# Patient Record
Sex: Female | Born: 1995
Health system: Southern US, Community
[De-identification: ages and names within clinical notes are randomized; demographics above are authoritative.]

---
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
## 2014-02-18 ENCOUNTER — Encounter (HOSPITAL_COMMUNITY): Payer: Self-pay | Admitting: Emergency Medicine

## 2014-02-18 ENCOUNTER — Emergency Department (HOSPITAL_COMMUNITY): Payer: Self-pay

## 2014-02-18 ENCOUNTER — Emergency Department (INDEPENDENT_AMBULATORY_CARE_PROVIDER_SITE_OTHER)
Admission: EM | Admit: 2014-02-18 | Discharge: 2014-02-18 | Disposition: A | Discharge status: Other Acute Inpatient Hospital | Payer: Self-pay | Source: Home / Self Care

## 2014-02-18 ENCOUNTER — Emergency Department (HOSPITAL_COMMUNITY)
Admission: EM | Admit: 2014-02-18 | Discharge: 2014-02-18 | Disposition: A | Discharge status: Home Health | Payer: Self-pay | Attending: Emergency Medicine | Admitting: Emergency Medicine

## 2014-02-18 DIAGNOSIS — S02609A Fracture of mandible, unspecified, initial encounter for closed fracture: Secondary | ICD-10-CM | POA: Insufficient documentation

## 2014-02-18 DIAGNOSIS — S199XXA Unspecified injury of neck, initial encounter: Secondary | ICD-10-CM

## 2014-02-18 DIAGNOSIS — X58XXXA Exposure to other specified factors, initial encounter: Secondary | ICD-10-CM

## 2014-02-18 DIAGNOSIS — Z79899 Other long term (current) drug therapy: Secondary | ICD-10-CM | POA: Insufficient documentation

## 2014-02-18 DIAGNOSIS — S0993XA Unspecified injury of face, initial encounter: Secondary | ICD-10-CM

## 2014-02-18 MED ORDER — HYDROCODONE-ACETAMINOPHEN 7.5-325 MG/15ML PO SOLN: 10.0000 mL | ORAL | Status: DC | PRN

## 2014-02-18 MED ORDER — MORPHINE SULFATE 4 MG/ML IJ SOLN
4.0000 mg | Freq: Once | INTRAMUSCULAR | Status: AC
  Administered 2014-02-18: 4 mg via INTRAMUSCULAR
  Filled 2014-02-18: qty 1
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
## 2014-02-18 NOTE — ED Notes (Signed)
Patient is resting comfortably. 
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
## 2014-02-18 NOTE — Discharge Instructions (Signed)
Fractured-Jaw Meal Plan The purpose of the fractured-jaw meal plan is to provide foods that can be easily blended and easily swallowed. This plan is typically used after jaw or mouth surgery, wired jaw surgery, or dental surgery. Foods in this plan need to be blended so that they can be sipped from a straw or given through a syringe. You should try to have at least three meals and three snacks daily. It is important to make sure you get enough calories and protein to prevent weight loss and help your body heal, especially after surgery. You may wish to include a liquid multivitamin in your plan to ensure that you get all the vitamins and minerals you need. Ask your health care provider for a recommendation.  HOW DO I PREPARE MY MEALS? All foods in this plan must be blended. Avoid nuts, seeds, skins, peels, bones, or any foods that cannot be blended to the right consistency. Make sure to eat a variety of foods from each food group every day. The following tips can help you as you blend your food:  Remove skins, seeds, and peels from food.  Cook meats and vegetables thoroughly.  Cut foods into small pieces and mix with a small amount of liquid in a food processor or blender. Continue to add liquid until the food becomes thin enough to sip through a straw.  Adding liquids such as juice, milk, cream, broth, gravy, or vegetable juice can help add flavor to foods.  Heat foods after they have been blended to reduce the amount of foam created from blending.  Heat or cool your foods to lukewarm temperatures if your teeth and mouth are sensitive to extreme temperatures. WHAT FOODS CAN I EAT? Make sure to eat a variety of foods from each food group.  Grains  Hot cereals, such as oatmeal, grits, ground wheat cereals, and polenta.  Rice and pasta.  Couscous. Vegetables  All cooked or canned vegetables, without seeds and skins.  Vegetable juices.  Cooked potatoes, without skins. Fruit  Any  cooked or canned fruits, without seeds and skins.  Fresh, peeled soft fruits, such as bananas and peaches, that can be blended until smooth.  All fruit juices, without seeds and skins. Meat and Other Protein Sources  Soft-boiled eggs, scrambled eggs, powdered eggs, pasteurized egg mixtures, and custard.  Ground meats, such as hamburger, [place]key, sausage, and meatloaf.  Tender, well-cooked meat, poultry, and fish prepared without bones or skin.  Soft soy foods (such as tofu).  Smooth nut butters. Dairy  All are allowed. Beverages  Coffee (regular or decaffeinated), tea, and mineral water. Condiments  All seasonings and condiments that blend well. WHEN MAY I NEED TO SUPPLEMENT MY MEALS? If you begin to lose weight on this plan, you may need to increase the amount of food you are eating or the number of calories in your food or both. You can increase the number of calories by adding any of the following foods:  Protein powder or powdered milk.  Extra fats, such as margarine (without trans fat), sour cream, cream cheese, cream, and nut butters, such as peanut butter or almond butter.  Sweets, such as honey, ice cream, blackstrap molasses, or sugar. Document Released: 01/21/2010 Document Revised: 08/08/2013 Document Reviewed: 06/30/2013 [facility] Patient Information 2015 [employer] [place]. This information is not intended to replace advice given to you by your health care provider. Make sure you discuss any questions you have with your health care provider.
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
## 2014-02-18 NOTE — ED Notes (Signed)
Returned to room from radiology
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
## 2014-02-18 NOTE — ED Notes (Signed)
MD at bedside.MD at bedside

## 2014-02-19 NOTE — ED Provider Notes (Signed)
Medical screening examination/treatment/procedure(s) were performed by non-physician practitioner and as supervising physician I was immediately available for consultation/collaboration.  Leslee Homeavid Rasmus Preusser, M.D.  Reuben Likesavid C Haruna Rohlfs, MD 02/19/14 [phone]

## 2014-02-19 NOTE — ED Provider Notes (Signed)
I saw and evaluated the patient, reviewed the resident's note and I agree with the findings and plan.   EKG Interpretation None      Patient's CT shows nondisplaced fx. Will give liquid pain meds, soft diet, etc. ENT f/u. Has mildly decreased opening of mouth but is able to open mouth and swallow  Audree CamelScott T Maebry Obrien, MD 02/19/14 1219

## 2014-09-28 ENCOUNTER — Encounter (HOSPITAL_COMMUNITY): Payer: Self-pay | Admitting: Emergency Medicine

## 2014-09-28 ENCOUNTER — Emergency Department (INDEPENDENT_AMBULATORY_CARE_PROVIDER_SITE_OTHER): Payer: Self-pay

## 2014-09-28 ENCOUNTER — Emergency Department (INDEPENDENT_AMBULATORY_CARE_PROVIDER_SITE_OTHER)
Admission: EM | Admit: 2014-09-28 | Discharge: 2014-09-28 | Disposition: A | Discharge status: Home / Self Care | Payer: Self-pay | Source: Home / Self Care | Attending: Emergency Medicine | Admitting: Emergency Medicine

## 2014-09-28 DIAGNOSIS — S60419A Abrasion of unspecified finger, initial encounter: Secondary | ICD-10-CM

## 2014-09-28 DIAGNOSIS — S60021A Contusion of right index finger without damage to nail, initial encounter: Secondary | ICD-10-CM

## 2014-09-28 MED ORDER — BACITRACIN 500 UNIT/GM EX OINT
1.0000 "application " | TOPICAL_OINTMENT | Freq: Once | CUTANEOUS | Status: AC
  Administered 2014-09-28: 1 via TOPICAL

## 2014-09-28 NOTE — Discharge Instructions (Signed)
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
## 2015-11-07 ENCOUNTER — Emergency Department (HOSPITAL_COMMUNITY)
Admission: EM | Admit: 2015-11-07 | Discharge: 2015-11-07 | Disposition: A | Discharge status: Home / Self Care | Payer: Self-pay | Attending: Emergency Medicine | Admitting: Emergency Medicine

## 2015-11-07 ENCOUNTER — Encounter (HOSPITAL_COMMUNITY): Payer: Self-pay | Admitting: Emergency Medicine

## 2015-11-07 DIAGNOSIS — R05 Cough: Secondary | ICD-10-CM | POA: Insufficient documentation

## 2015-11-07 DIAGNOSIS — R509 Fever, unspecified: Secondary | ICD-10-CM | POA: Insufficient documentation

## 2015-11-07 DIAGNOSIS — R519 Headache, unspecified: Secondary | ICD-10-CM

## 2015-11-07 DIAGNOSIS — M542 Cervicalgia: Secondary | ICD-10-CM | POA: Insufficient documentation

## 2015-11-07 DIAGNOSIS — R51 Headache: Secondary | ICD-10-CM | POA: Insufficient documentation

## 2015-11-07 DIAGNOSIS — R000 Tachycardia, unspecified: Secondary | ICD-10-CM | POA: Insufficient documentation

## 2015-11-07 DIAGNOSIS — Z793 Long term (current) use of hormonal contraceptives: Secondary | ICD-10-CM | POA: Insufficient documentation

## 2015-11-07 DIAGNOSIS — J029 Acute pharyngitis, unspecified: Secondary | ICD-10-CM | POA: Insufficient documentation

## 2015-11-07 DIAGNOSIS — Z3202 Encounter for pregnancy test, result negative: Secondary | ICD-10-CM | POA: Insufficient documentation

## 2015-11-07 DIAGNOSIS — R6889 Other general symptoms and signs: Secondary | ICD-10-CM

## 2015-11-07 LAB — URINALYSIS, ROUTINE W REFLEX MICROSCOPIC
Bilirubin Urine: NEGATIVE
Glucose, UA: NEGATIVE mg/dL
Ketones, ur: NEGATIVE mg/dL
Leukocytes, UA: NEGATIVE
Nitrite: NEGATIVE
PROTEIN: NEGATIVE mg/dL
SPECIFIC GRAVITY, URINE: 1.009 (ref 1.005–1.030)
pH: 7.5 (ref 5.0–8.0)

## 2015-11-07 LAB — COMPREHENSIVE METABOLIC PANEL
ALT: 13 U/L — AB (ref 14–54)
AST: 22 U/L (ref 15–41)
Albumin: 3.7 g/dL (ref 3.5–5.0)
Alkaline Phosphatase: 53 U/L (ref 38–126)
Anion gap: 10 (ref 5–15)
BUN: 7 mg/dL (ref 6–20)
CHLORIDE: 105 mmol/L (ref 101–111)
CO2: 24 mmol/L (ref 22–32)
CREATININE: 0.68 mg/dL (ref 0.44–1.00)
Calcium: 9 mg/dL (ref 8.9–10.3)
GFR calc Af Amer: >60 mL/min (ref >60)
Glucose, Bld: 105 mg/dL — ABNORMAL HIGH (ref 65–99)
Potassium: 3.8 mmol/L (ref 3.5–5.1)
SODIUM: 139 mmol/L (ref 135–145)
Total Bilirubin: 0.1 mg/dL — ABNORMAL LOW (ref 0.3–1.2)
Total Protein: 8.1 g/dL (ref 6.5–8.1)

## 2015-11-07 LAB — CBC WITH DIFFERENTIAL/PLATELET
BASOS ABS: 0 10*3/uL (ref 0.0–0.1)
Basophils Relative: 0 %
EOS PCT: 0 %
Eosinophils Absolute: 0 10*3/uL (ref 0.0–0.7)
HCT: 36.4 % (ref 36.0–46.0)
HEMOGLOBIN: 11.9 g/dL — AB (ref 12.0–15.0)
LYMPHS PCT: 42 %
Lymphs Abs: 2.5 10*3/uL (ref 0.7–4.0)
MCH: 27.9 pg (ref 26.0–34.0)
MCHC: 32.7 g/dL (ref 30.0–36.0)
MCV: 85.4 fL (ref 78.0–100.0)
Monocytes Absolute: 0.4 10*3/uL (ref 0.1–1.0)
Monocytes Relative: 7 %
NEUTROS PCT: 51 %
Neutro Abs: 3.1 10*3/uL (ref 1.7–7.7)
PLATELETS: 337 10*3/uL (ref 150–400)
RBC: 4.26 MIL/uL (ref 3.87–5.11)
RDW: 12.5 % (ref 11.5–15.5)
WBC: 6.1 10*3/uL (ref 4.0–10.5)

## 2015-11-07 LAB — URINE MICROSCOPIC-ADD ON

## 2015-11-07 LAB — I-STAT CG4 LACTIC ACID, ED: Lactic Acid, Venous: 0.9 mmol/L (ref 0.5–2.0)

## 2015-11-07 LAB — I-STAT BETA HCG BLOOD, ED (MC, WL, AP ONLY)

## 2015-11-07 MED ORDER — KETOROLAC TROMETHAMINE 30 MG/ML IJ SOLN
30.0000 mg | Freq: Once | INTRAMUSCULAR | Status: AC
  Administered 2015-11-07: 30 mg via INTRAVENOUS
  Filled 2015-11-07: qty 1

## 2015-11-07 MED ORDER — ACETAMINOPHEN 325 MG PO TABS
650.0000 mg | ORAL_TABLET | Freq: Once | ORAL | Status: AC
  Administered 2015-11-07: 650 mg via ORAL
  Filled 2015-11-07: qty 2

## 2015-11-07 MED ORDER — SODIUM CHLORIDE 0.9 % IV BOLUS (SEPSIS)
1000.0000 mL | Freq: Once | INTRAVENOUS | Status: AC
  Administered 2015-11-07: 1000 mL via INTRAVENOUS

## 2015-11-07 MED ORDER — IBUPROFEN 600 MG PO TABS: 600.0000 mg | ORAL_TABLET | Freq: Four times a day (QID) | ORAL | Status: DC | PRN

## 2015-11-07 NOTE — ED Provider Notes (Signed)
CSN: [id number]     Arrival date & time 11/07/15  [id number] History   First MD Initiated Contact with Patient 11/07/15 1028     Chief Complaint  Patient presents with  . Headache  . Torticollis  . Tachycardia     (Consider location/radiation/quality/duration/timing/severity/associated sxs/prior Treatment) HPI Gloria Chambers is a 20 y.o. female with no medical problems, presents to emergency department complaining of a headache for 4 days, swollen lymph nodes in the right side of the neck, pain to the right side of the neck, cough, scratchy throat. States that the headache is worse with coughing or with loud noises. She denies similar headaches in the past. Denies nausea or vomiting. Denies photophobia. Denies neck stiffness. States that she has had close contacts with few people who had the flu. Denies any urinary symptoms or vaginal discharge. States that she took some Excedrin Migraine which did not help.  History reviewed. No pertinent past medical history. History reviewed. No pertinent past surgical history. History reviewed. No pertinent family history. Social History  Substance Use Topics  . Smoking status: Never Smoker   . Smokeless tobacco: None  . Alcohol Use: No   OB History    No data available     Review of Systems  Constitutional: Positive for fever and chills.  HENT: Positive for sore throat.   Eyes: Negative for photophobia.  Respiratory: Positive for cough. Negative for chest tightness and shortness of breath.   Cardiovascular: Negative for chest pain, palpitations and leg swelling.  Gastrointestinal: Negative for nausea, vomiting, abdominal pain and diarrhea.  Genitourinary: Negative for dysuria, flank pain, vaginal bleeding, vaginal discharge, vaginal pain and pelvic pain.  Musculoskeletal: Negative for myalgias, arthralgias, neck pain and neck stiffness.  Skin: Negative for rash.  Neurological: Positive for headaches. Negative for dizziness, weakness and  light-headedness.  All other systems reviewed and are negative.     Allergies  Review of patient's allergies indicates no known allergies.  Home Medications   Prior to Admission medications   Medication Sig Start Date End Date Taking? Authorizing Provider  aspirin-acetaminophen-caffeine (EXCEDRIN MIGRAINE) [id number] MG tablet Take 1 tablet by mouth every 6 (six) hours as needed for headache.   Yes Historical Provider, MD  Norgestimate-Ethinyl Estradiol Triphasic (ORTHO TRI-CYCLEN, 28,) 0.18/0.215/0.25 MG-35 MCG tablet Take 1 tablet by mouth daily.   Yes Historical Provider, MD   BP 121/77 mmHg  Pulse 137  Temp(Src) 101 F (38.3 C) (Oral)  Resp 18  SpO2 100% Physical Exam  Constitutional: She is oriented to person, place, and time. She appears well-developed and well-nourished. No distress.  HENT:  Head: Normocephalic and atraumatic.  Eyes: Conjunctivae and EOM are normal. Pupils are equal, round, and reactive to light.  Neck: Neck supple.  Right anterior cervical lymphadenopathy. No neck stiffness. No meningismus.   Cardiovascular: Normal rate, regular rhythm and normal heart sounds.   Pulmonary/Chest: Effort normal and breath sounds normal. No respiratory distress. She has no wheezes. She has no rales.  Abdominal: Soft. Bowel sounds are normal. She exhibits no distension. There is no tenderness. There is no rebound.  Musculoskeletal: She exhibits no edema.  Lymphadenopathy:    She has cervical adenopathy.  Neurological: She is alert and oriented to person, place, and time. No cranial nerve deficit.  Skin: Skin is warm and dry.  Psychiatric: She has a normal mood and affect. Her behavior is normal.  Nursing note and vitals reviewed.   ED Course  Procedures (including critical care time) Labs Review  Labs Reviewed  CBC WITH DIFFERENTIAL/PLATELET - Abnormal; Notable for the following:    Hemoglobin 11.9 (*)    All other components within normal limits  COMPREHENSIVE  METABOLIC PANEL - Abnormal; Notable for the following:    Glucose, Bld 105 (*)    ALT 13 (*)    Total Bilirubin 0.1 (*)    All other components within normal limits  URINALYSIS, ROUTINE W REFLEX MICROSCOPIC (NOT AT [facility]) - Abnormal; Notable for the following:    APPearance CLOUDY (*)    Hgb urine dipstick LARGE (*)    All other components within normal limits  URINE MICROSCOPIC-ADD ON - Abnormal; Notable for the following:    Squamous Epithelial / LPF 6-30 (*)    Bacteria, UA FEW (*)    All other components within normal limits  I-STAT BETA HCG BLOOD, ED ([facility], [facility], [facility] ONLY)  I-STAT CG4 LACTIC ACID, ED    Imaging Review No results found. I have personally reviewed and evaluated these images and lab results as part of my medical decision-making.   EKG Interpretation None      MDM   Final diagnoses:  Flu-like symptoms  Nonintractable headache, unspecified chronicity pattern, unspecified headache type   Pt with fever, chills, headache, right sided neck pain, cough, scratchy throat. Denies severe sore throat, states its just "scratchy." no neck stiffness. Full rom of the neck. Pt is tachycardic, febrile, otherwise normal VS. Will get labs, Fluids started. Will treat with tylenol and toradol.   Filed Vitals:   11/07/15 1013 11/07/15 1058  BP: 121/77   Pulse: 137   Temp: 99.6 F (37.6 C) 101 F (38.3 C)  TempSrc: Oral Oral  Resp: 18   SpO2: 100%      2:35 PM Patient feels much better. On the reexamination temperature improved to 98.9. Heart rate down to the 80s. Blood pressure remains normal. Patient states headache improved. Continues to have no signs of meningitis. Most likely influenza. Will discharge home with symptomatic treatment and close outpatient follow-up.  Filed Vitals:   11/07/15 1058 11/07/15 1213 11/07/15 1259 11/07/15 1330  BP:  104/64 110/66 113/82  Pulse:  97 96 91  Temp: 101 F (38.3 C) 100.5 F (38.1 C) 98.9 F (37.2 C)   TempSrc: Oral Oral Oral    Resp:  18 16   SpO2:  100% 99% 100%     Jaynie Crumble, PA-C 11/07/15 1541  Azalia Bilis, MD 11/07/15 [phone]

## 2015-11-07 NOTE — ED Notes (Signed)
Pt ambulatory to the restroom. Pt instructed on how to provide a clean catch urine and pt verbalized understanding.

## 2015-11-07 NOTE — ED Notes (Signed)
Pt c/o full headache x 4 days. Pt states she's also had some neck stiffness, temp in triage is 99.6. States she can't cough or laugh because it makes her headache worse. No photophobia, but loud noise worsens it. States OTC meds not helping, unlike any headache she's had before. Denies nausea, vomiting. Tachycardic in triage at 137

## 2015-11-07 NOTE — Discharge Instructions (Signed)
Your blood work is normal here. Make sure to take ibuprofen and tylenol for fever and headache. Stay hydrated. Rest. Follow up with your doctor as needed.  Influenza, Adult Influenza ("the flu") is a viral infection of the respiratory tract. It occurs more often in winter months because people spend more time in close contact with one another. Influenza can make you feel very sick. Influenza easily spreads from person to person (contagious). CAUSES  Influenza is caused by a virus that infects the respiratory tract. You can catch the virus by breathing in droplets from an infected person's cough or sneeze. You can also catch the virus by touching something that was recently contaminated with the virus and then touching your mouth, nose, or eyes. RISKS AND COMPLICATIONS You may be at risk for a more severe case of influenza if you smoke cigarettes, have diabetes, have chronic heart disease (such as heart failure) or lung disease (such as asthma), or if you have a weakened immune system. Elderly people and pregnant women are also at risk for more serious infections. The most common problem of influenza is a lung infection (pneumonia). Sometimes, this problem can require emergency medical care and may be life threatening. SIGNS AND SYMPTOMS  Symptoms typically last 4 to 10 days and may include:  Fever.  Chills.  Headache, body aches, and muscle aches.  Sore throat.  Chest discomfort and cough.  Poor appetite.  Weakness or feeling tired.  Dizziness.  Nausea or vomiting. DIAGNOSIS  Diagnosis of influenza is often made based on your history and a physical exam. A nose or throat swab test can be done to confirm the diagnosis. TREATMENT  In mild cases, influenza goes away on its own. Treatment is directed at relieving symptoms. For more severe cases, your health care provider may prescribe antiviral medicines to shorten the sickness. Antibiotic medicines are not effective because the infection  is caused by a virus, not by bacteria. HOME CARE INSTRUCTIONS  Take medicines only as directed by your health care provider.  Use a cool mist humidifier to make breathing easier.  Get plenty of rest until your temperature returns to normal. This usually takes 3 to 4 days.  Drink enough fluid to keep your urine clear or pale yellow.  Cover yourmouth and nosewhen coughing or sneezing,and wash your handswellto prevent thevirusfrom spreading.  Stay homefromwork orschool untilthe fever is gonefor at least 881full day. PREVENTION  An annual influenza vaccination (flu shot) is the best way to avoid getting influenza. An annual flu shot is now routinely recommended for all adults in the [place] SEEK MEDICAL CARE IF:  You experiencechest pain, yourcough worsens,or you producemore mucus.  Youhave nausea,vomiting, ordiarrhea.  Your fever returns or gets worse. SEEK IMMEDIATE MEDICAL CARE IF:  You havetrouble breathing, you become short of breath,or your skin ornails becomebluish.  You have severe painor stiffnessin the neck.  You develop a sudden headache, or pain in the face or ear.  You have nausea or vomiting that you cannot control. MAKE SURE YOU:   Understand these instructions.  Will watch your condition.  Will get help right away if you are not doing well or get worse.   This information is not intended to replace advice given to you by your health care provider. Make sure you discuss any questions you have with your health care provider.   Document Released: 07/31/2000 Document Revised: 08/24/2014 Document Reviewed: 11/02/2011 Elsevier Interactive Patient Education [employer]2016 Elsevier Inc.

## 2015-11-07 NOTE — ED Notes (Signed)
Patient aware we need urine, does not feel like she can go at this time, fluids running, will reassess
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
## 2018-01-22 ENCOUNTER — Inpatient Hospital Stay (HOSPITAL_COMMUNITY)
Admission: AD | Admit: 2018-01-22 | Discharge: 2018-01-22 | Disposition: A | Discharge status: Home / Self Care | Payer: No Typology Code available for payment source | Source: Ambulatory Visit | Attending: Obstetrics & Gynecology | Admitting: Obstetrics & Gynecology

## 2018-01-22 ENCOUNTER — Other Ambulatory Visit: Payer: Self-pay

## 2018-01-22 DIAGNOSIS — N912 Amenorrhea, unspecified: Secondary | ICD-10-CM | POA: Diagnosis not present

## 2018-01-22 NOTE — MAU Note (Signed)
Pt presents for pregnancy confirmation.  Reports took HPT 01/16/2018, test +.  Reports LMP 11/12/2016.

## 2018-01-22 NOTE — Discharge Instructions (Signed)
First Trimester of Pregnancy The first trimester of pregnancy is from week 1 until the end of week 13 (months 1 through 3). During this time, your baby will begin to develop inside you. At 6-8 weeks, the eyes and face are formed, and the heartbeat can be seen on ultrasound. At the end of 12 weeks, all the baby's organs are formed. Prenatal care is all the medical care you receive before the birth of your baby. Make sure you get good prenatal care and follow all of your doctor's instructions. Follow these instructions at home: Medicines  Take over-the-counter and prescription medicines only as told by your doctor. Some medicines are safe and some medicines are not safe during pregnancy.  Take a prenatal vitamin that contains at least 600 micrograms (mcg) of folic acid.  If you have trouble pooping (constipation), take medicine that will make your stool soft (stool softener) if your doctor approves. Eating and drinking  Eat regular, healthy meals.  Your doctor will tell you the amount of weight gain that is right for you.  Avoid raw meat and uncooked cheese.  If you feel sick to your stomach (nauseous) or throw up (vomit): ? Eat 4 or 5 small meals a day instead of 3 large meals. ? Try eating a few soda crackers. ? Drink liquids between meals instead of during meals.  To prevent constipation: ? Eat foods that are high in fiber, like fresh fruits and vegetables, whole grains, and beans. ? Drink enough fluids to keep your pee (urine) clear or pale yellow. Activity  Exercise only as told by your doctor. Stop exercising if you have cramps or pain in your lower belly (abdomen) or low back.  Do not exercise if it is too hot, too humid, or if you are in a place of great height (high altitude).  Try to avoid standing for long periods of time. Move your legs often if you must stand in one place for a long time.  Avoid heavy lifting.  Wear low-heeled shoes. Sit and stand up straight.  You  can have sex unless your doctor tells you not to. Relieving pain and discomfort  Wear a good support bra if your breasts are sore.  Take warm water baths (sitz baths) to soothe pain or discomfort caused by hemorrhoids. Use hemorrhoid cream if your doctor says it is okay.  Rest with your legs raised if you have leg cramps or low back pain.  If you have puffy, bulging veins (varicose veins) in your legs: ? Wear support hose or compression stockings as told by your doctor. ? Raise (elevate) your feet for 15 minutes, 3-4 times a day. ? Limit salt in your food. Prenatal care  Schedule your prenatal visits by the twelfth week of pregnancy.  Write down your questions. Take them to your prenatal visits.  Keep all your prenatal visits as told by your doctor. This is important. Safety  Wear your seat belt at all times when driving.  Make a list of emergency phone numbers. The list should include numbers for family, friends, the hospital, and police and fire departments. General instructions  Ask your doctor for a referral to a local prenatal class. Begin classes no later than at the start of month 6 of your pregnancy.  Ask for help if you need counseling or if you need help with nutrition. Your doctor can give you advice or tell you where to go for help.  Do not use hot tubs, steam rooms, or   saunas.  Do not douche or use tampons or scented sanitary pads.  Do not cross your legs for long periods of time.  Avoid all herbs and alcohol. Avoid drugs that are not approved by your doctor.  Do not use any tobacco products, including cigarettes, chewing tobacco, and electronic cigarettes. If you need help quitting, ask your doctor. You may get counseling or other support to help you quit.  Avoid cat litter boxes and soil used by cats. These carry germs that can cause birth defects in the baby and can cause a loss of your baby (miscarriage) or stillbirth.  Visit your dentist. At home, brush  your teeth with a soft toothbrush. Be gentle when you floss. Contact a doctor if:  You are dizzy.  You have mild cramps or pressure in your lower belly.  You have a nagging pain in your belly area.  You continue to feel sick to your stomach, you throw up, or you have watery poop (diarrhea).  You have a bad smelling fluid coming from your vagina.  You have pain when you pee (urinate).  You have increased puffiness (swelling) in your face, hands, legs, or ankles. Get help right away if:  You have a fever.  You are leaking fluid from your vagina.  You have spotting or bleeding from your vagina.  You have very bad belly cramping or pain.  You gain or lose weight rapidly.  You throw up blood. It may look like coffee grounds.  You are around people who have German measles, fifth disease, or chickenpox.  You have a very bad headache.  You have shortness of breath.  You have any kind of trauma, such as from a fall or a car accident. Summary  The first trimester of pregnancy is from week 1 until the end of week 13 (months 1 through 3).  To take care of yourself and your unborn baby, you will need to eat healthy meals, take medicines only if your doctor tells you to do so, and do activities that are safe for you and your baby.  Keep all follow-up visits as told by your doctor. This is important as your doctor will have to ensure that your baby is healthy and growing well. This information is not intended to replace advice given to you by your health care provider. Make sure you discuss any questions you have with your health care provider. Document Released: 01/20/2008 Document Revised: 08/11/2016 Document Reviewed: 08/11/2016 Elsevier Interactive Patient Education  2017 Elsevier Inc.  

## 2018-01-22 NOTE — MAU Provider Note (Signed)
Ms. Gloria Chambers is a 22 y.o. G0 who present to MAU today for pregnancy confirmation. She denies abdominal pain or vaginal bleeding.She also denies fever. She had +HPT recently and LMP 11/12/17.   BP (!) 123/55 (BP Location: Right Arm)   Pulse 78   Temp 98.7 F (37.1 C) (Oral)   Resp 20   Ht 4\' 10"  (1.473 m)   Wt 116 lb (52.6 kg)   LMP 11/12/2017 (Exact Date)   SpO2 100%   BMI 24.24 kg/m  CONSTITUTIONAL: Well-developed, well-nourished female in no acute distress.  CARDIOVASCULAR: Normal heart rate noted RESPIRATORY: Effort and breath sounds normal GASTROINTESTINAL:Soft, no distention noted.  No tenderness, rebound or guarding.  SKIN: Skin is warm and dry. No rash noted. Not diaphoretic. No erythema. No pallor. PSYCHIATRIC: Normal mood and affect. Normal behavior. Normal judgment and thought content.  MDM Medical screening exam complete Patient does not endorse any symptoms concerning for ectopic pregnancy or pregnancy related complication today.   A:  Amenorrhea  P: Discharge home Patient advised that she can present as a walk-in to [facility] for a pregnancy test M-Th between 8am-4pm or Friday between 8am -11am Reasons to return to MAU reviewed  Patient may return to MAU as needed or if her condition were to change or worsen  Marny LowensteinWenzel, Haislee Corso N, PA-C 01/22/2018 2:52 PM

## 2018-01-24 ENCOUNTER — Ambulatory Visit (INDEPENDENT_AMBULATORY_CARE_PROVIDER_SITE_OTHER): Payer: No Typology Code available for payment source | Admitting: *Deleted

## 2018-01-24 ENCOUNTER — Encounter: Payer: Self-pay | Admitting: Family Medicine

## 2018-01-24 ENCOUNTER — Inpatient Hospital Stay (HOSPITAL_COMMUNITY)
Admission: AD | Admit: 2018-01-24 | Discharge: 2018-01-24 | Discharge status: Absent without leave | Payer: No Typology Code available for payment source | Attending: Obstetrics and Gynecology | Admitting: Obstetrics and Gynecology

## 2018-01-24 DIAGNOSIS — Z3200 Encounter for pregnancy test, result unknown: Secondary | ICD-10-CM

## 2018-01-24 DIAGNOSIS — Z3201 Encounter for pregnancy test, result positive: Secondary | ICD-10-CM | POA: Diagnosis not present

## 2018-01-24 LAB — POCT PREGNANCY, URINE: Preg Test, Ur: POSITIVE — AB

## 2018-01-24 NOTE — Progress Notes (Signed)
Pt informed of +UPT today. She reports sure LMP 12/12/17.  EDD 09/18/18.  Medication reconciliation completed. Pt will schedule prenatal care @ the office of her choice.

## 2018-01-24 NOTE — Progress Notes (Signed)
Chart reviewed for nurse visit. Agree with plan of care.   Rolm Bookbindereill, Caroline M, [place] 01/24/2018 7:55 PM

## 2018-02-24 ENCOUNTER — Encounter: Payer: Self-pay | Admitting: Nurse Practitioner

## 2018-02-24 ENCOUNTER — Encounter: Payer: No Typology Code available for payment source | Admitting: Nurse Practitioner

## 2018-08-02 MED FILL — HYDROCODON-APAP 5-325: 5-325 | 2 days supply | Qty: 10 | Fill #0

## 2018-08-31 ENCOUNTER — Ambulatory Visit (HOSPITAL_COMMUNITY)
Admission: EM | Admit: 2018-08-31 | Discharge: 2018-08-31 | Disposition: A | Discharge status: Home / Self Care | Payer: No Typology Code available for payment source | Attending: Family Medicine | Admitting: Family Medicine

## 2018-08-31 ENCOUNTER — Other Ambulatory Visit: Payer: Self-pay

## 2018-08-31 ENCOUNTER — Encounter (HOSPITAL_COMMUNITY): Payer: Self-pay | Admitting: Emergency Medicine

## 2018-08-31 DIAGNOSIS — R69 Illness, unspecified: Secondary | ICD-10-CM | POA: Insufficient documentation

## 2018-08-31 DIAGNOSIS — J111 Influenza due to unidentified influenza virus with other respiratory manifestations: Secondary | ICD-10-CM

## 2018-08-31 MED ORDER — HYDROCODONE-HOMATROPINE 5-1.5 MG/5ML PO SYRP: 5.0000 mL | ORAL_SOLUTION | Freq: Four times a day (QID) | ORAL | 0 refills | Status: DC | PRN

## 2018-08-31 MED ORDER — OSELTAMIVIR PHOSPHATE 75 MG PO CAPS: 75.0000 mg | ORAL_CAPSULE | Freq: Two times a day (BID) | ORAL | 0 refills | Status: AC

## 2018-08-31 MED FILL — OSELTAMIVIR PHOSPHATE 75 MG: 75 | 5 days supply | Qty: 10 | Fill #0

## 2018-08-31 MED FILL — HYDROCODONE-HOMATROPINE SYR: 5-1.5 | 4 days supply | Qty: 90 | Fill #0

## 2018-08-31 NOTE — ED Provider Notes (Signed)
[facility]   [id number] 08/31/18 Arrival Time: 1537  ASSESSMENT & PLAN:  1. Influenza-like illness    See AVS for discharge instructions.  Meds ordered this encounter  Medications  . HYDROcodone-homatropine (HYCODAN) 5-1.5 MG/5ML syrup    Sig: Take 5 mLs by mouth every 6 (six) hours as needed for cough.    Dispense:  90 mL    Refill:  0  . oseltamivir (TAMIFLU) 75 MG capsule    Sig: Take 1 capsule (75 mg total) by mouth 2 (two) times daily for 5 days.    Dispense:  10 capsule    Refill:  0   Cough medication sedation precautions. Discussed typical duration of symptoms. OTC symptom care as needed. Ensure adequate fluid intake and rest. May f/u with PCP or here as needed.  Reviewed expectations re: course of current medical issues. Questions answered. Outlined signs and symptoms indicating need for more acute intervention. Patient verbalized understanding. After Visit Summary given.   SUBJECTIVE: History from: patient.  Gloria D Nelly RoutDawkins is a 23 y.o. female who presents with complaint of nasal congestion, post-nasal drainage, and a persistent dry cough; without sore throat. Onset abrupt, 2 days ago; with fatigue and with body aches. SOB: none. Wheezing: none. Does reports intermittent generalized headache with current symptoms. Not the worst headache of her life. Fever: subjective with chills. Overall normal PO intake without n/v. Known sick contacts: no. No specific or significant aggravating or alleviating factors reported. OTC treatment: ibuprofen and elderberry without much relief.   Social History   Tobacco Use  Smoking Status Never Smoker  Smokeless Tobacco Never Used    ROS: As per HPI.   OBJECTIVE:  Vitals:   08/31/18 1555  BP: 110/73  Pulse: 98  Resp: 18  Temp: 99 F (37.2 C)  TempSrc: Oral  SpO2: 100%     General appearance: alert; appears fatigued HEENT: nasal congestion; clear runny nose; throat irritation secondary to post-nasal  drainage Neck: supple without LAD CV: RRR Lungs: unlabored respirations, symmetrical air entry without wheezing; cough: moderate Abd: soft Ext: no LE edema Skin: warm and dry Psychological: alert and cooperative; normal mood and affect   No Known Allergies  History reviewed. No pertinent past medical history. History reviewed. No pertinent family history. Social History   Socioeconomic History  . Marital status: Single    Spouse name: Not on file  . Number of children: Not on file  . Years of education: Not on file  . Highest education level: Not on file  Occupational History  . Not on file  Social Needs  . Financial resource strain: Not on file  . Food insecurity:    Worry: Not on file    Inability: Not on file  . Transportation needs:    Medical: Not on file    Non-medical: Not on file  Tobacco Use  . Smoking status: Never Smoker  . Smokeless tobacco: Never Used  Substance and Sexual Activity  . Alcohol use: No  . Drug use: Yes    Frequency: 7.0 times per week    Types: Marijuana  . Sexual activity: Yes    Birth control/protection: Pill  Lifestyle  . Physical activity:    Days per week: Not on file    Minutes per session: Not on file  . Stress: Not on file  Relationships  . Social connections:    Talks on phone: Not on file    Gets together: Not on file    Attends religious  service: Not on file    Active member of club or organization: Not on file    Attends meetings of clubs or organizations: Not on file    Relationship status: Not on file  . Intimate partner violence:    Fear of current or ex partner: Not on file    Emotionally abused: Not on file    Physically abused: Not on file    Forced sexual activity: Not on file  Other Topics Concern  . Not on file  Social History Narrative  . Not on file           Mardella Layman, MD 09/03/18 [phone]

## 2018-08-31 NOTE — Discharge Instructions (Signed)

## 2018-08-31 NOTE — ED Triage Notes (Signed)
Pt reports cough, nasal congestion, sore throat and headache x2 days.  She reports a "shock" feeling in her head intermittently.  Pt has been taking ibuprofen and elderberry for her symptoms.

## 2020-09-04 ENCOUNTER — Other Ambulatory Visit (HOSPITAL_COMMUNITY): Payer: Self-pay

## 2020-09-04 MED FILL — AMOXICILLIN 500 MG CAPSULE: 500 | 10 days supply | Qty: 30 | Fill #0

## 2022-03-13 ENCOUNTER — Other Ambulatory Visit (HOSPITAL_COMMUNITY): Payer: Self-pay

## 2022-03-13 MED FILL — Ibuprofen Tab 800 MG: ORAL | 4 days supply | Qty: 16 | Fill #0 | Status: AC

## 2022-03-13 MED FILL — Amoxicillin (Trihydrate) Tab 875 MG: ORAL | 10 days supply | Qty: 20 | Fill #0 | Status: AC

## 2024-06-19 ENCOUNTER — Other Ambulatory Visit (HOSPITAL_COMMUNITY): Payer: Self-pay

## 2024-06-19 ENCOUNTER — Other Ambulatory Visit: Payer: Self-pay | Admitting: Dermatology

## 2024-06-19 MED ORDER — HYDROCORTISONE 2.5 % EX CREA
TOPICAL_CREAM | Freq: Two times a day (BID) | CUTANEOUS | 11 refills | Status: AC | PRN
  Filled 2024-06-19: qty 30, 14d supply, fill #0

## 2024-06-19 NOTE — Progress Notes (Signed)
 Pt has hives after eating smoked sardines.  Have non pruritic hives on exam.  Denies SOB, facial swelling or cough.  Currently taking PO benadryl and PO Zyrtec.   Sending in topical HCT 2.5% to be applied BID for 2 weeks.

## 2024-09-22 ENCOUNTER — Ambulatory Visit (HOSPITAL_BASED_OUTPATIENT_CLINIC_OR_DEPARTMENT_OTHER)

## 2024-09-22 ENCOUNTER — Encounter (HOSPITAL_BASED_OUTPATIENT_CLINIC_OR_DEPARTMENT_OTHER): Payer: Self-pay

## 2024-09-22 VITALS — BP 103/76 | HR 87 | Wt 116.6 lb

## 2024-09-22 DIAGNOSIS — Z3480 Encounter for supervision of other normal pregnancy, unspecified trimester: Secondary | ICD-10-CM | POA: Insufficient documentation

## 2024-09-22 DIAGNOSIS — O3680X0 Pregnancy with inconclusive fetal viability, not applicable or unspecified: Secondary | ICD-10-CM

## 2024-09-22 DIAGNOSIS — Z3A01 Less than 8 weeks gestation of pregnancy: Secondary | ICD-10-CM

## 2024-09-22 DIAGNOSIS — N912 Amenorrhea, unspecified: Secondary | ICD-10-CM

## 2024-09-22 LAB — POCT URINE PREGNANCY: Preg Test, Ur: POSITIVE — AB

## 2024-09-22 NOTE — Progress Notes (Signed)
 NURSE VISIT- PREGNANCY CONFIRMATION   SUBJECTIVE:  Gloria Chambers is a 29 y.o. G2P0010 female at [redacted]w[redacted]d by uncertain LMP. Patient's last menstrual period was 08/03/2024 (approximate). Here for pregnancy confirmation.  Home pregnancy test: positive x 10  She reports no complaints.  She is taking prenatal vitamins.    We discussed EDD of 05/10/2025 based on LMP of 08/03/2024. I reviewed her allergies, medications and Medical/Surgical/OB history.    OBJECTIVE:  BP 103/76 (BP Location: Left Arm, Patient Position: Sitting, Cuff Size: Normal)   Pulse 87   Wt 116 lb 9.6 oz (52.9 kg)   LMP 08/03/2024 (Approximate)   SpO2 100%   BMI 24.37 kg/m   Appears well, in no apparent distress  Results for orders placed or performed in visit on 09/22/24 (from the past 24 hours)  POCT urine pregnancy   Collection Time: 09/22/24 10:52 AM  Result Value Ref Range   Preg Test, Ur Positive (A) Negative    ASSESSMENT: Positive pregnancy test in office. Approximate LMP 08/03/2024. EDD 05/10/2025    Patient Active Problem List   Diagnosis Date Noted   Supervision of other normal pregnancy, antepartum 09/22/2024    PLAN: Prenatal vitamins: continue. Nausea medicines: not currently needed. New OB packet provided and reviewed with patient. Advised to return completed paperwork at new OB appointment. New OB appointment scheduled for 11/10/2024 with Dr.Miller due to patient's work schedule. Viability scan ordered and scheduled for 10/18/2024.  Concerns addressed today  MyChart/[facility] MyChart access verified. I explained pt will have some visits in office and some virtually. [facility] instructions given and order placed. Patient verifies receipt of registration text/e-mail. Account successfully created and app downloaded. If patient is a candidate for Optimized scheduling, add to sticky note.   Blood Pressure Cuff/Weight Scale Patient has private insurance; instructed to purchase blood pressure cuff and  bring to first prenatal appt. Explained after first prenatal appt pt will check weekly and document in [facility]. Patient does not have weight scale; patient may purchase if they desire to track weight weekly in [facility].  Is patient a candidate for [facility] Optimization? Yes, patient accepted   Daleen Steinhaus E, RN 09/22/2024  10:52 AM

## 2024-10-18 ENCOUNTER — Other Ambulatory Visit (HOSPITAL_BASED_OUTPATIENT_CLINIC_OR_DEPARTMENT_OTHER)

## 2024-11-10 ENCOUNTER — Encounter (HOSPITAL_BASED_OUTPATIENT_CLINIC_OR_DEPARTMENT_OTHER): Payer: Self-pay | Admitting: Obstetrics & Gynecology
